# Patient Record
Sex: Male | Born: 1980 | Race: White | Hispanic: No | Marital: Married | Smoking: Never smoker
Health system: Southern US, Community
[De-identification: ages and names within clinical notes are randomized; demographics above are authoritative.]

---

## 2016-12-20 ENCOUNTER — Emergency Department (HOSPITAL_BASED_OUTPATIENT_CLINIC_OR_DEPARTMENT_OTHER): Payer: BLUE CROSS/BLUE SHIELD

## 2016-12-20 ENCOUNTER — Emergency Department (HOSPITAL_BASED_OUTPATIENT_CLINIC_OR_DEPARTMENT_OTHER)
Admission: EM | Admit: 2016-12-20 | Discharge: 2016-12-20 | Disposition: A | Discharge status: Home / Self Care | Payer: BLUE CROSS/BLUE SHIELD | Attending: Emergency Medicine | Admitting: Emergency Medicine

## 2016-12-20 ENCOUNTER — Encounter (HOSPITAL_BASED_OUTPATIENT_CLINIC_OR_DEPARTMENT_OTHER): Payer: Self-pay | Admitting: *Deleted

## 2016-12-20 DIAGNOSIS — M62838 Other muscle spasm: Secondary | ICD-10-CM | POA: Diagnosis not present

## 2016-12-20 DIAGNOSIS — Y999 Unspecified external cause status: Secondary | ICD-10-CM | POA: Diagnosis not present

## 2016-12-20 DIAGNOSIS — M542 Cervicalgia: Secondary | ICD-10-CM | POA: Diagnosis present

## 2016-12-20 DIAGNOSIS — Y939 Activity, unspecified: Secondary | ICD-10-CM | POA: Diagnosis not present

## 2016-12-20 DIAGNOSIS — X58XXXA Exposure to other specified factors, initial encounter: Secondary | ICD-10-CM | POA: Insufficient documentation

## 2016-12-20 DIAGNOSIS — M436 Torticollis: Secondary | ICD-10-CM | POA: Insufficient documentation

## 2016-12-20 DIAGNOSIS — Y929 Unspecified place or not applicable: Secondary | ICD-10-CM | POA: Diagnosis not present

## 2016-12-20 DIAGNOSIS — S161XXA Strain of muscle, fascia and tendon at neck level, initial encounter: Secondary | ICD-10-CM | POA: Diagnosis not present

## 2016-12-20 MED ORDER — METHOCARBAMOL 500 MG PO TABS: 500.0000 mg | ORAL_TABLET | Freq: Two times a day (BID) | ORAL | 0 refills | Status: AC

## 2016-12-20 MED ORDER — DIAZEPAM 5 MG PO TABS
5.0000 mg | ORAL_TABLET | Freq: Once | ORAL | Status: AC
  Administered 2016-12-20: 5 mg via ORAL
  Filled 2016-12-20: qty 1

## 2016-12-20 NOTE — Discharge Instructions (Signed)
You can take Tylenol or Ibuprofen as directed for pain.  Take Robaxin as directed to help with muscular tension. Do not take any until tomorrow.   As we discussed you can apply ice to the area today and then begin applying heat tomorrow.  Make sure you are stretching and performing ROM exercises to help the muscle start to stretch.   Do not participate in any heavy lifting for the next 2-3 days while your muscle heal.  As we discussed, if you are not improved after 2-3 days you need to return to the Emergency Department or your primary care doctor.  Return to the Emergency Department for any worsening neck pain, dizziness, numbness/weakness of your arms or legs, difficulty walking, urinary or bowel accidents, chest pain, difficulty breathing or any other worsening or concerning symptoms.

## 2016-12-20 NOTE — ED Provider Notes (Signed)
[facility]-EMERGENCY DEPT Provider Note   CSN: [id number] Arrival date & time: 12/20/16  1758     History   Chief Complaint Chief Complaint  Patient presents with  . Neck Pain    HPI Timothy Horton is a 36 y.o. male who presents to the emergency department with neck pain that began at 11 AM this morning. Patient states that he was in his car putting something away when he turned his neck, heard and felt a pop and started experiencing left-sided neck pain. Patient reports that since then the pain has persisted. He reports difficulty moving it secondary to pain and stiffness. Patient reports that he will adamantly have some tingling that radiates down his left upper extremity but denies any numbness/weakness of his arms or legs. Patient denies any preceding traumas, falls, injuries. He does state that he works in a [employer] site and regularly lifts heavy objects. Patient has been able to ambulate without any difficulty since onset of symptoms. He has not taken any medications or tried any at home treatments. Denies fevers, weight loss, numbness/weakness of upper and lower extremities, bowel/bladder incontinence, saddle anesthesia, history of back surgery, history of IVDA. He denies any headache, dizziness, chest pain, SOB, nausea/vomiting.   The history is provided by the patient.    History reviewed. No pertinent past medical history.  There are no active problems to display for this patient.   History reviewed. No pertinent surgical history.     Home Medications    Prior to Admission medications   Medication Sig Start Date End Date Taking? Authorizing Provider  methocarbamol (ROBAXIN) 500 MG tablet Take 1 tablet (500 mg total) by mouth 2 (two) times daily. 12/20/16   Maxwell CaulLayden, Chrisandra Wiemers A, PA-C    Family History History reviewed. No pertinent family history.  Social History Social History  Substance Use Topics  . Smoking status: Never Smoker  . Smokeless tobacco: Never Used  .  Alcohol use No     Allergies   Patient has no known allergies.   Review of Systems Review of Systems  Constitutional: Negative for diaphoresis, fever and unexpected weight change.  Respiratory: Negative for cough and shortness of breath.   Cardiovascular: Negative for chest pain.  Gastrointestinal: Negative for abdominal pain, nausea and vomiting.  Genitourinary: Negative for dysuria and hematuria.  Musculoskeletal: Positive for neck pain and neck stiffness. Negative for back pain and gait problem.  Neurological: Negative for dizziness, weakness, numbness and headaches.     Physical Exam Updated Vital Signs BP (!) 141/95 (BP Location: Right Arm)   Pulse 72   Temp 98.4 F (36.9 C) (Oral)   Resp 18   Ht 5\' 9"  (1.753 m)   Wt 97.5 kg (215 lb)   SpO2 99%   BMI 31.75 kg/m   Physical Exam  Constitutional: He is oriented to person, place, and time. He appears well-developed and well-nourished.  HENT:  Head: Normocephalic and atraumatic.  Mouth/Throat: Oropharynx is clear and moist and mucous membranes are normal.  Eyes: Pupils are equal, round, and reactive to light. Conjunctivae, EOM and lids are normal.  Neck: Trachea normal. Neck supple. Muscular tenderness present. No spinous process tenderness present. Carotid bruit is not present (Bilaterally). No neck rigidity. Decreased range of motion present. No thyroid mass present.    No abnormalities with swallowing. Diffuse muscular tenderness to the left paraspinal muscles of the cervical region that extends down into the left trapezius overlying the left scapula. Patient has some mild muscular tension and swelling  to the posterior left paraspinal muscles with this cervical region consistent with muscle spasm. Flexion and extension intact without difficulty. Limited lateral movement of neck secondary to patient's pain and stiffness. No midline bony tenderness.  No overlying warmth, erythema, ecchymosis. No carotid bruit bilaterally.  No pulsatile mass.  Cardiovascular: Normal rate, regular rhythm, normal heart sounds and normal pulses.  Exam reveals no gallop and no friction rub.   No murmur heard. Pulses:      Radial pulses are 2+ on the right side, and 2+ on the left side.  Pulmonary/Chest: Effort normal and breath sounds normal.  Abdominal: Soft. Normal appearance. There is no tenderness. There is no rigidity and no guarding.  Musculoskeletal:       Thoracic back: He exhibits no tenderness.       Lumbar back: He exhibits no tenderness.  No midline T or L-spine tenderness. No deformities or crepitus noted.  Neurological: He is alert and oriented to person, place, and time. GCS eye subscore is 4. GCS verbal subscore is 5. GCS motor subscore is 6.  Cranial nerves III-XII intact Follows commands, Moves all extremities  5/5 strength to BUE and BLE  Sensation intact throughout  Normal finger to nose. No dysdiadochokinesia. No pronator drift. No gait abnormalities  No slurred speech. No facial droop.   Skin: Skin is warm and dry. Capillary refill takes less than 2 seconds.  Psychiatric: He has a normal mood and affect. His speech is normal.  Nursing note and vitals reviewed.    ED Treatments / Results  Labs (all labs ordered are listed, but only abnormal results are displayed) Labs Reviewed - No data to display  EKG  EKG Interpretation None       Radiology Dg Cervical Spine Complete  Result Date: 12/20/2016 CLINICAL DATA:  Posterior and left neck pain extending into the left shoulder and arm with some tingling in the hand after hearing a loud popping sound while turning his head and body today. EXAM: CERVICAL SPINE - COMPLETE 4+ VIEW COMPARISON:  None. 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 Otherwise, normal appearing bones and soft tissues. No prevertebral soft tissue swelling, fractures or subluxations. 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 Otherwise, normal examination. Electronically Signed   By: Beckie Salts M.D.   On: 12/20/2016 18:57    Procedures Procedures (including critical care time)  Medications Ordered in ED Medications  diazepam (VALIUM) tablet 5 mg (5 mg Oral Given 12/20/16 1849)     Initial Impression / Assessment and Plan / ED Course  I have reviewed the triage vital signs and the nursing notes.  Pertinent labs & imaging results that were available during my care of the patient were reviewed by me and considered in my medical decision making (see chart for details).     36 year old male who presents with neck pain that began at 11 AM this morning. No preceding trauma, injury, fall. Patient is afebrile, non-toxic appearing, sitting comfortably on examination table. Vital signs reviewed and stable. No neuro deficits noted on exam. No red flag symptoms. Consider muscular strain versus acute torticollis. Will provide medication in the department for symptomatic relief. Will check neck XR for evaluation of any acute fracture, though low suspicion given history/physical exam. History/physical exam are not concerning for carotid dissection.  Reevaluation after Valium. Patient reports improvement in symptoms. His range of motion is slightly improved after medication. X-ray results pending.  X-ray reviewed. It shows levo convex consistent  with muscle spasm. Discussed results with patient. His range of motion has improved after Valium. Patient reports that he still feels tight but reports improvement in symptoms. Symptoms likely result of torticollis/muscles strain. Conservative therapies discussed. Will plan to treat symptomatically. Provided patient with a list of clinic resources to use if he does not have a PCP. Instructed to call them today to arrange follow-up in the next 24-48 hours. Return precautions discussed. Patient expresses understanding and agreement to plan.    Final Clinical  Impressions(s) / ED Diagnoses   Final diagnoses:  Acute strain of neck muscle, initial encounter  Muscle spasm  Torticollis, acute    New Prescriptions Discharge Medication List as of 12/20/2016  7:39 PM    START taking these medications   Details  methocarbamol (ROBAXIN) 500 MG tablet Take 1 tablet (500 mg total) by mouth 2 (two) times daily., Starting Fri 12/20/2016, Print         Maxwell CaulLayden, Anessia [place] A, PA-C 12/21/16 1122    Linwood DibblesKnapp, Jon, MD 12/24/16 [id number]

## 2016-12-20 NOTE — ED Triage Notes (Signed)
Pt reports that he turned his head today, heard and felt a pop in the left side of his neck and has had pain since that time.

## 2018-06-02 IMAGING — CR DG CERVICAL SPINE COMPLETE 4+V
6 series · 6 of 6 positions shown · non-contrast
Comparison: None.

CLINICAL DATA: Posterior and left neck pain extending into the left
shoulder and arm with some tingling in the hand after Yalinet Gb
Chernor Cheruy while turning his head and body today.

EXAM:
CERVICAL SPINE - COMPLETE 4+ VIEW

[w c-spine lat]
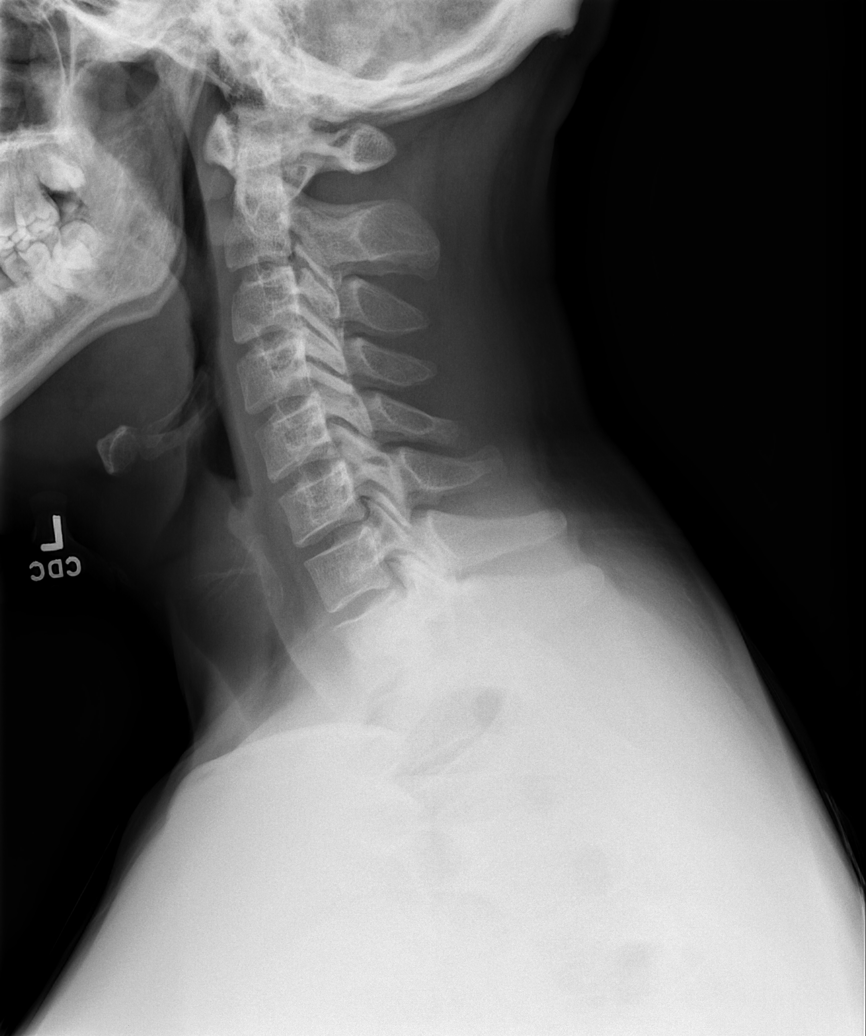

[w c-spine oblique (1 of 2)]
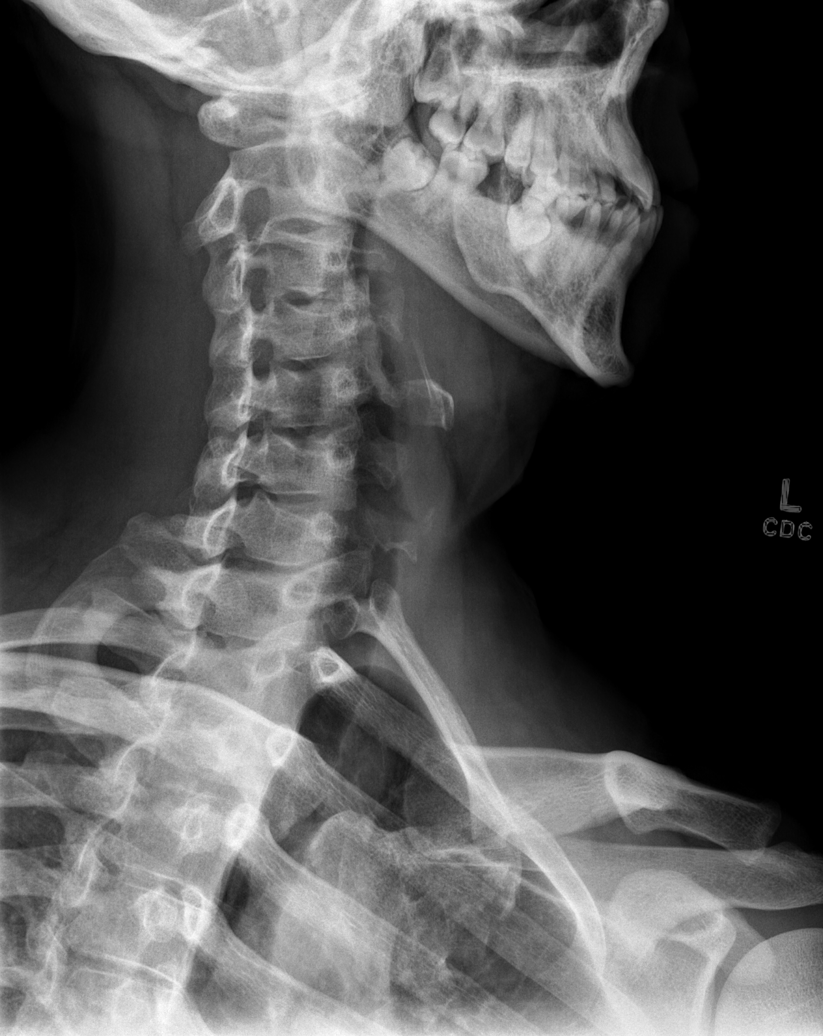

[w c-spine oblique (2 of 2)]
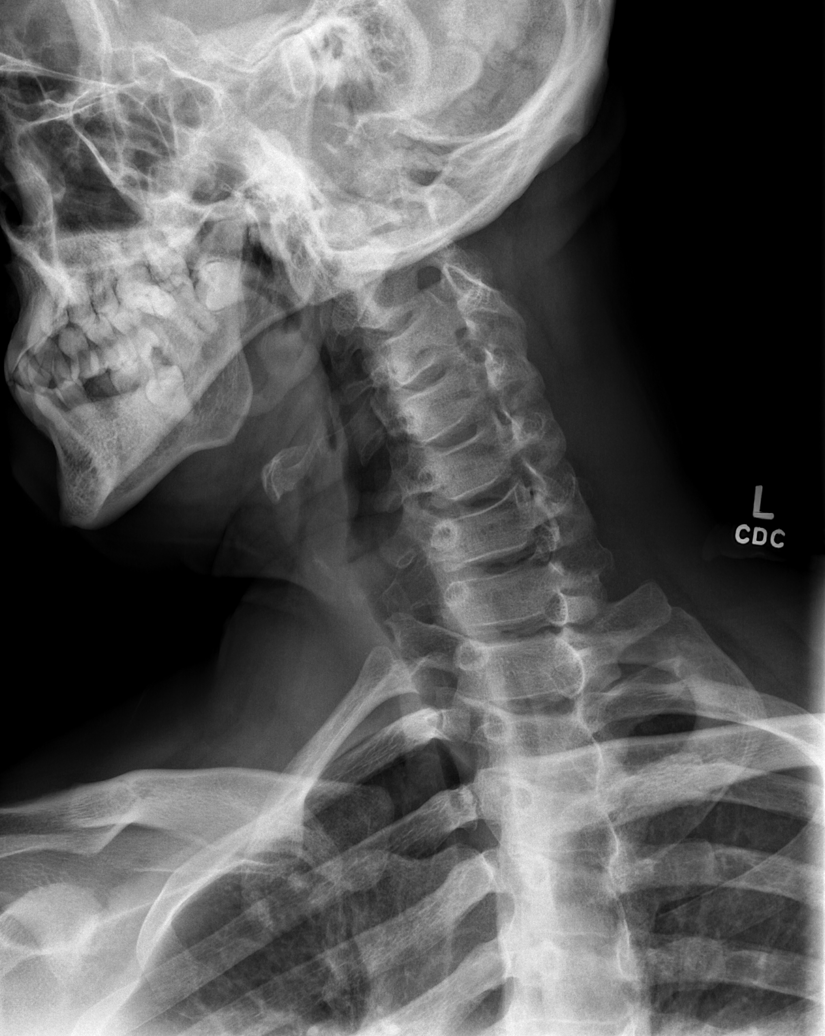

[w c-spine a.p.]
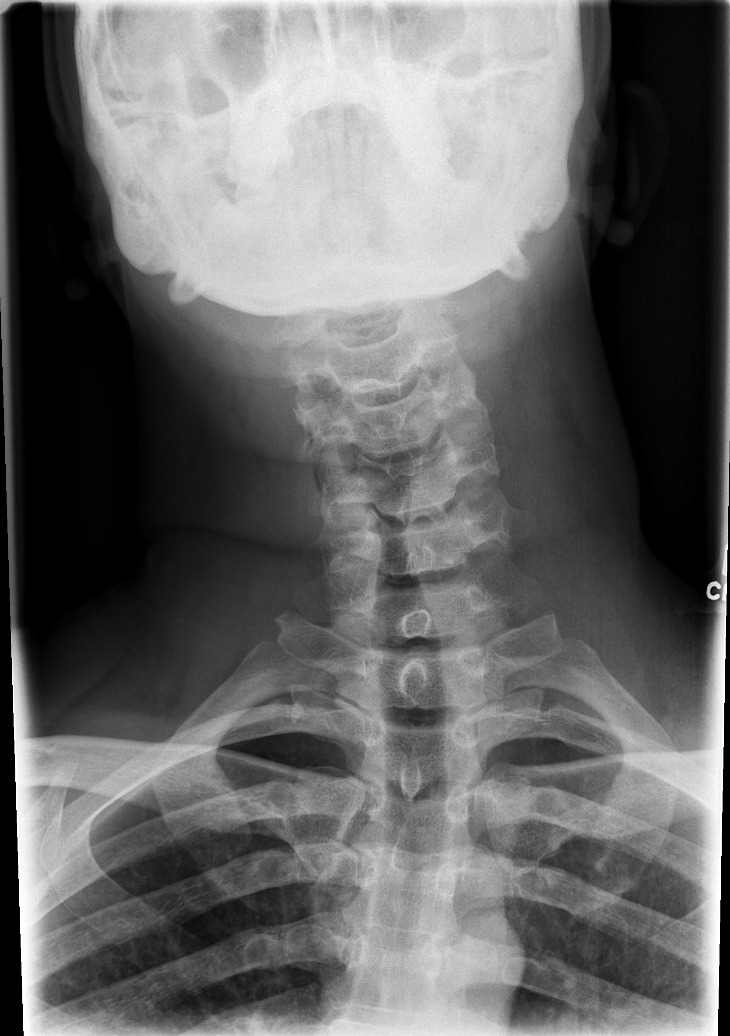

[w c-spine odontoid (1 of 2)]
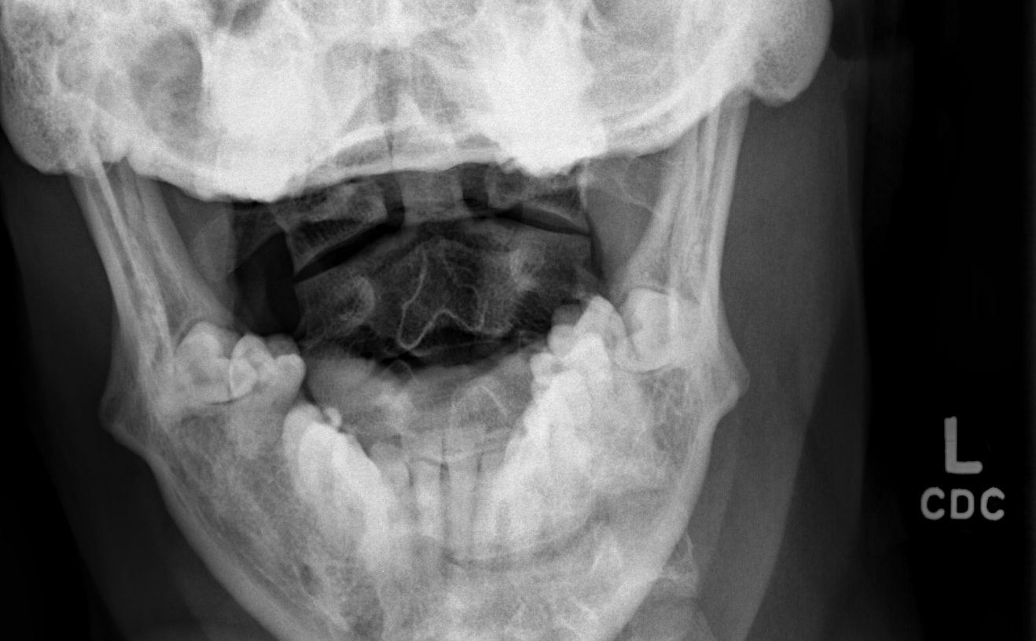

[w c-spine odontoid (2 of 2)]
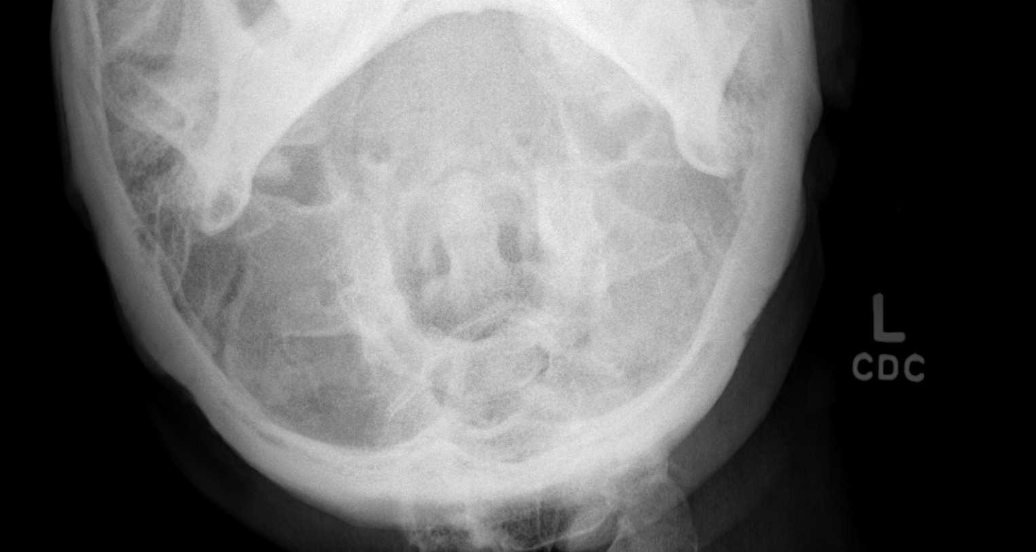

[6 of 6 positions shown; findings below may reference images not displayed]

FINDINGS: Mild to moderate levoconvex cervicothoracic scoliosis. Otherwise,
normal appearing bones and soft tissues. No prevertebral soft tissue
swelling, fractures or subluxations.
IMPRESSION: Mild to moderate levoconvex cervicothoracic scoliosis, compatible
with muscle spasm. Otherwise, normal examination.
